# Patient Record
Sex: Male | Born: 1990 | Race: Black or African American | Hispanic: No | Marital: Single | Smoking: Never smoker
Health system: Southern US, Community
[De-identification: ages and names within clinical notes are randomized; demographics above are authoritative.]

---
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
## 2013-12-06 ENCOUNTER — Encounter (HOSPITAL_COMMUNITY): Payer: Self-pay | Admitting: Emergency Medicine

## 2013-12-06 ENCOUNTER — Emergency Department (HOSPITAL_COMMUNITY)
Admission: EM | Admit: 2013-12-06 | Discharge: 2013-12-06 | Disposition: A | Discharge status: Home / Self Care | Payer: BC Managed Care – PPO | Attending: Emergency Medicine | Admitting: Emergency Medicine

## 2013-12-06 ENCOUNTER — Emergency Department (HOSPITAL_COMMUNITY): Payer: BC Managed Care – PPO

## 2013-12-06 DIAGNOSIS — Y9367 Activity, basketball: Secondary | ICD-10-CM | POA: Insufficient documentation

## 2013-12-06 DIAGNOSIS — S99929A Unspecified injury of unspecified foot, initial encounter: Principal | ICD-10-CM

## 2013-12-06 DIAGNOSIS — R269 Unspecified abnormalities of gait and mobility: Secondary | ICD-10-CM | POA: Insufficient documentation

## 2013-12-06 DIAGNOSIS — Y9239 Other specified sports and athletic area as the place of occurrence of the external cause: Secondary | ICD-10-CM | POA: Insufficient documentation

## 2013-12-06 DIAGNOSIS — M25561 Pain in right knee: Secondary | ICD-10-CM

## 2013-12-06 DIAGNOSIS — S8990XA Unspecified injury of unspecified lower leg, initial encounter: Secondary | ICD-10-CM | POA: Insufficient documentation

## 2013-12-06 DIAGNOSIS — X500XXA Overexertion from strenuous movement or load, initial encounter: Secondary | ICD-10-CM | POA: Insufficient documentation

## 2013-12-06 DIAGNOSIS — S99919A Unspecified injury of unspecified ankle, initial encounter: Principal | ICD-10-CM

## 2013-12-06 DIAGNOSIS — Y92838 Other recreation area as the place of occurrence of the external cause: Secondary | ICD-10-CM

## 2013-12-06 MED ORDER — MELOXICAM 7.5 MG PO TABS: 7.5000 mg | ORAL_TABLET | Freq: Every day | ORAL | Status: AC

## 2013-12-06 NOTE — ED Notes (Signed)
Patient given discharge instruction, verbalized understand. Patient ambulatory out of the department.  

## 2013-12-06 NOTE — ED Provider Notes (Signed)
CSN: [id number]     Arrival date & time 12/06/13  1122 History   First MD Initiated Contact with Patient 12/06/13 1152     Chief Complaint  Patient presents with  . Knee Injury     (Consider location/radiation/quality/duration/timing/severity/associated sxs/prior Treatment) Patient is a 23 y.o. male presenting with knee pain. The history is provided by the patient.  Knee Pain Location:  Knee Time since incident:  1 day Injury: yes   Knee location:  R knee Pain details:    Quality:  Aching  Eda KeysMontae R Varnum is a 23 y.o. male who presents to the ED with right knee pain that started yesterday after he was playing basketball yesterday. He was running down the court, jumped up and when he came down felt a pop. He tried to walk it off and later noted swelling and increased pain. Today the pain is not quite as bad but the knee feels real stiff.  He denies any other injuries.   History reviewed. No pertinent past medical history. History reviewed. No pertinent past surgical history. History reviewed. No pertinent family history. History  Substance Use Topics  . Smoking status: Never Smoker   . Smokeless tobacco: Not on file  . Alcohol Use: No    Review of Systems Negative except as stated in HPI   Allergies  Review of patient's allergies indicates no known allergies.  Home Medications   Prior to Admission medications   Not on File   BP 167/91  Pulse 79  Temp(Src) 98.3 F (36.8 C) (Oral)  Resp 18  Ht 6\' 3"  (1.905 m)  Wt 215 lb (97.523 kg)  BMI 26.87 kg/m2  SpO2 100% Physical Exam  Nursing note and vitals reviewed. Constitutional: He is oriented to person, place, and time. He appears well-developed and well-nourished. No distress.  HENT:  Head: Normocephalic.  Eyes: EOM are normal.  Neck: Neck supple.  Cardiovascular: Normal rate.   Pulmonary/Chest: Effort normal.  Musculoskeletal:       Right knee: He exhibits normal range of motion, no ecchymosis, no deformity, no  laceration, no erythema and normal alignment. Swelling: minimal. Tenderness found. MCL and LCL tenderness noted.  Pedal pulses strong, adequate circulation, good touch sensation. Pain with range of motion.  Neurological: He is alert and oriented to person, place, and time. He has normal strength. No sensory deficit. Abnormal gait: due to pain.  Skin: Skin is warm and dry.  Psychiatric: He has a normal mood and affect. His behavior is normal.    ED Course  Procedures Dg Knee Complete 4 Views Right  12/06/2013   CLINICAL DATA:  Injury.  Right knee pain.  EXAM: RIGHT KNEE - COMPLETE 4+ VIEW  COMPARISON:  None.  FINDINGS: There is no evidence of fracture, dislocation, or joint effusion. There is no evidence of arthropathy or other focal bone abnormality. Soft tissues are unremarkable.  IMPRESSION: Negative.   Electronically Signed   By: Amie Portlandavid  Ormond M.D.   On: 12/06/2013 12:30     MDM  23 y.o. male with right knee pain x 24 hours s/p injury. I have reviewed this patient's vital signs, nurses notes, appropriate labs and imaging. I have discussed findings with the patient. I discussed that x-rays will only show fractures and dislocations but will not show tendon/ligament injuries. Discussed need for follow up with ortho is symptoms persist. Patient voices understanding. Placed in knee immobilizer, ice, elevation and pain management.    Medication List  meloxicam 7.5 MG tablet  Commonly known as:  MOBIC  Take 1 tablet (7.5 mg total) by mouth daily.              CottondaleHope M Neese, NP 12/08/13 [phone]  Janne NapoleonHope M Neese, NP 12/08/13 [phone]  Medical screening examination/treatment/procedure(s) were performed by non-physician practitioner and as supervising physician I was immediately available for consultation/collaboration.   EKG Interpretation None       Donnetta HutchingBrian Nickisha Hum, MD 12/08/13 [phone]

## 2013-12-06 NOTE — ED Notes (Signed)
Playing ball yesterday and came down on right knee and feels like he hyperextended it.

## 2013-12-06 NOTE — Care Management Note (Signed)
ED/CM noted patient did not have health insurance and/or PCP listed in the computer.  Patient was given the [place] resource handout with information on the clinics, food pantries, and the handout for new health insurance sign-up.  Patient expressed appreciation for information received. 

## 2015-09-12 IMAGING — CR DG KNEE COMPLETE 4+V*R*
4 series · 4 of 4 positions shown · non-contrast
Comparison: None.

CLINICAL DATA: Injury.  Right knee pain.

EXAM:
RIGHT KNEE - COMPLETE 4+ VIEW

[view not recorded (1 of 4)]
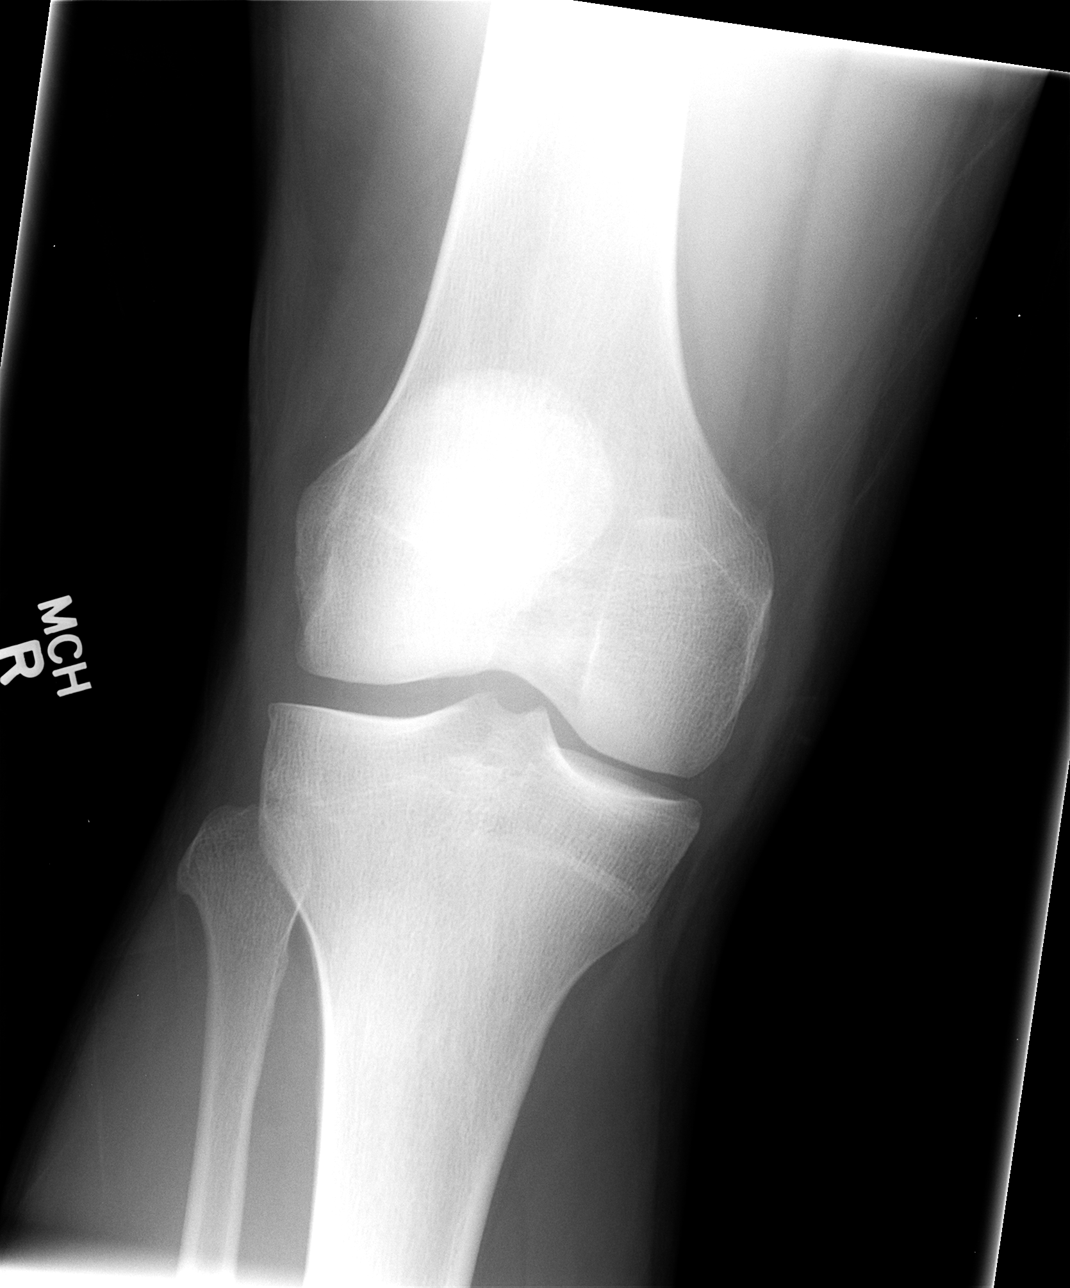

[view not recorded (2 of 4)]
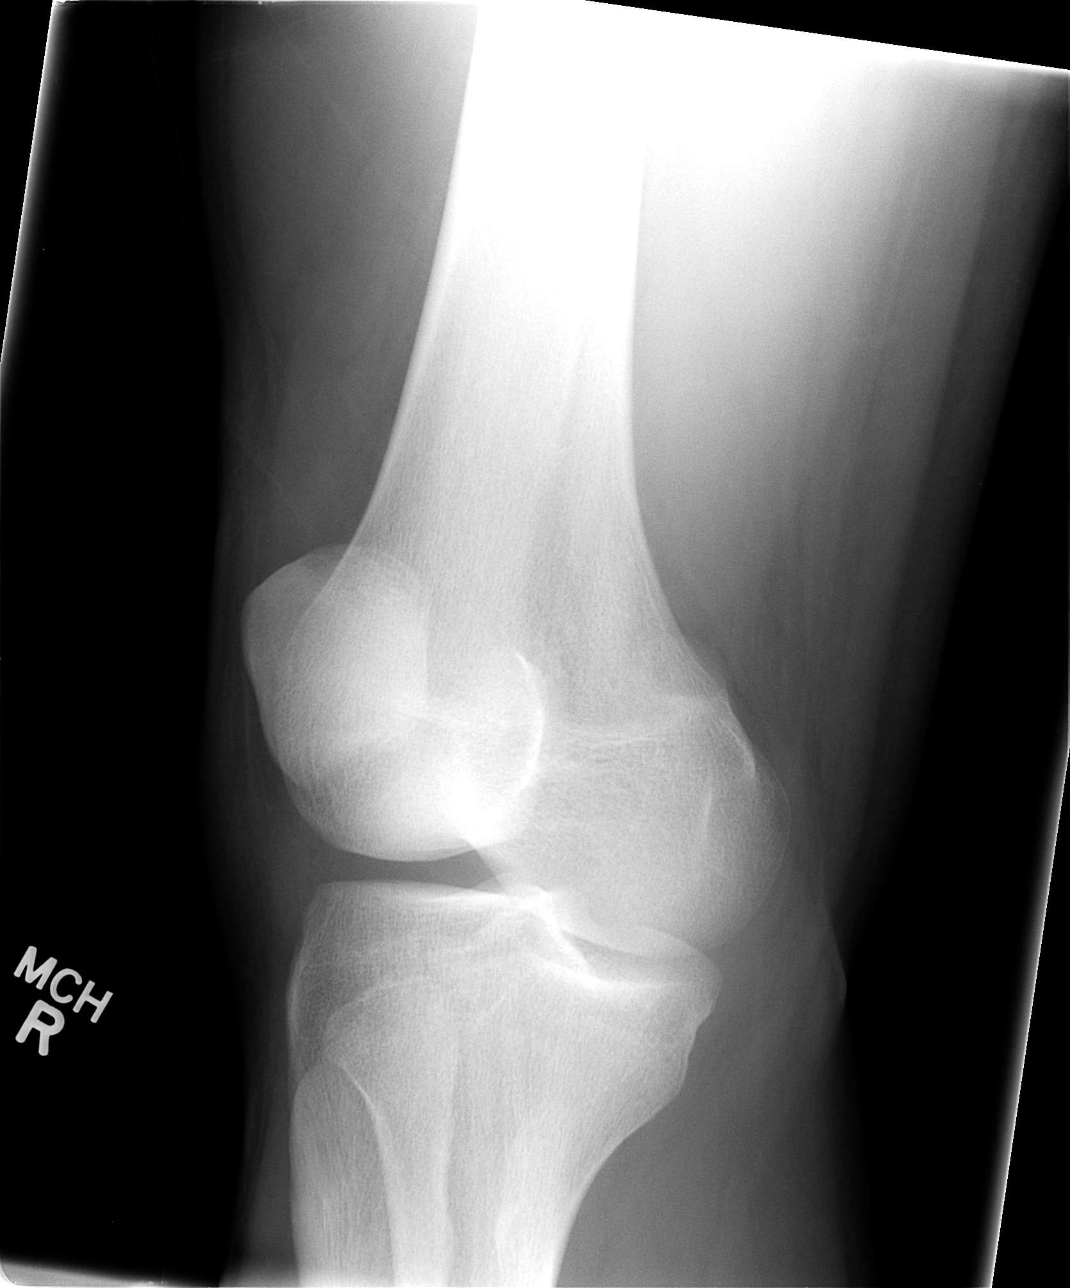

[view not recorded (3 of 4)]
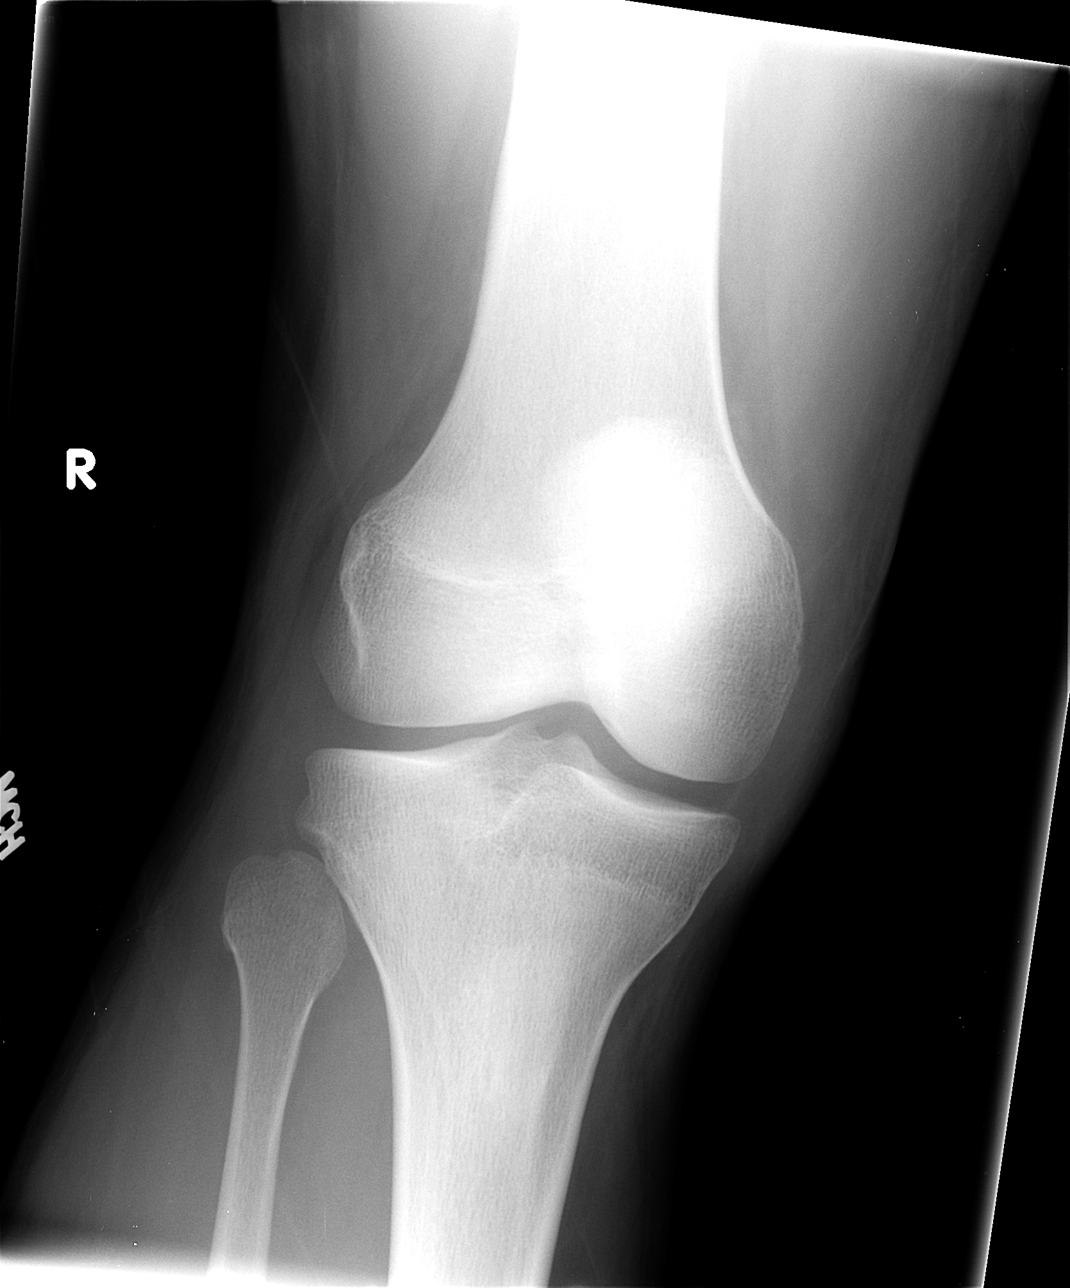

[view not recorded (4 of 4)]
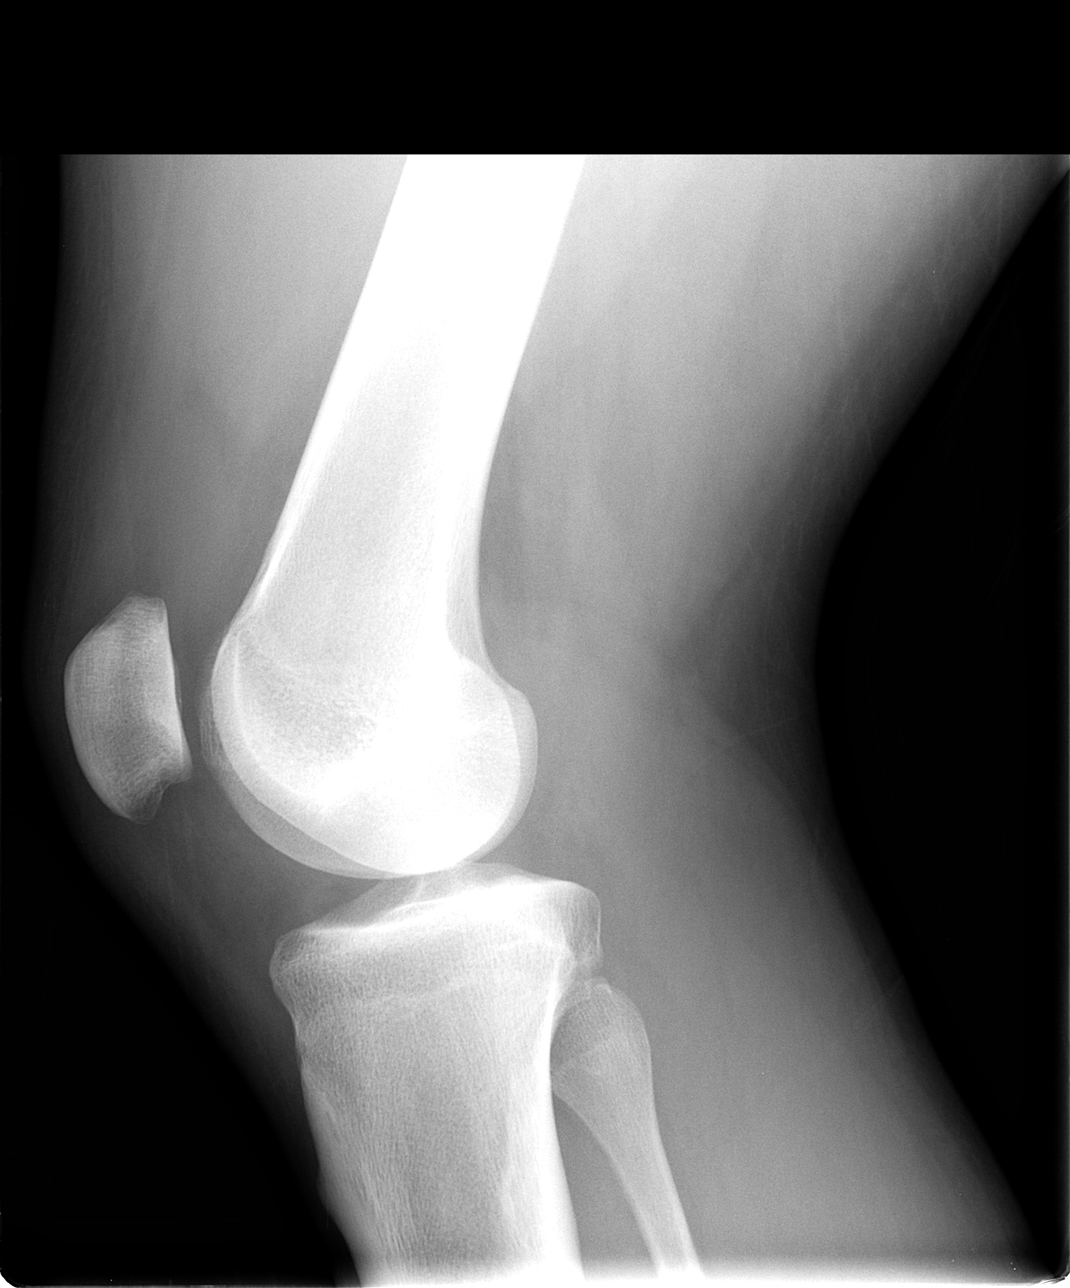

[4 of 4 positions shown; findings below may reference images not displayed]

FINDINGS: There is no evidence of fracture, dislocation, or joint effusion.
There is no evidence of arthropathy or other focal bone abnormality.
Soft tissues are unremarkable.
IMPRESSION: Negative.

## 2017-06-29 ENCOUNTER — Other Ambulatory Visit: Payer: Self-pay | Admitting: Physical Medicine and Rehabilitation

## 2017-06-29 ENCOUNTER — Ambulatory Visit
Admission: RE | Admit: 2017-06-29 | Discharge: 2017-06-29 | Disposition: A | Discharge status: Home / Self Care | Payer: No Typology Code available for payment source | Source: Ambulatory Visit | Attending: Physical Medicine and Rehabilitation | Admitting: Physical Medicine and Rehabilitation

## 2017-06-29 DIAGNOSIS — Z021 Encounter for pre-employment examination: Secondary | ICD-10-CM

## 2019-04-05 IMAGING — DX DG CHEST 1V
1 series · 1 of 1 positions shown · non-contrast
Comparison: None in PACs

CLINICAL DATA: Pre-employment physical examination. No current
complaints. Never smoked.

EXAM:
CHEST 1 VIEW

[dg chest 1 view]
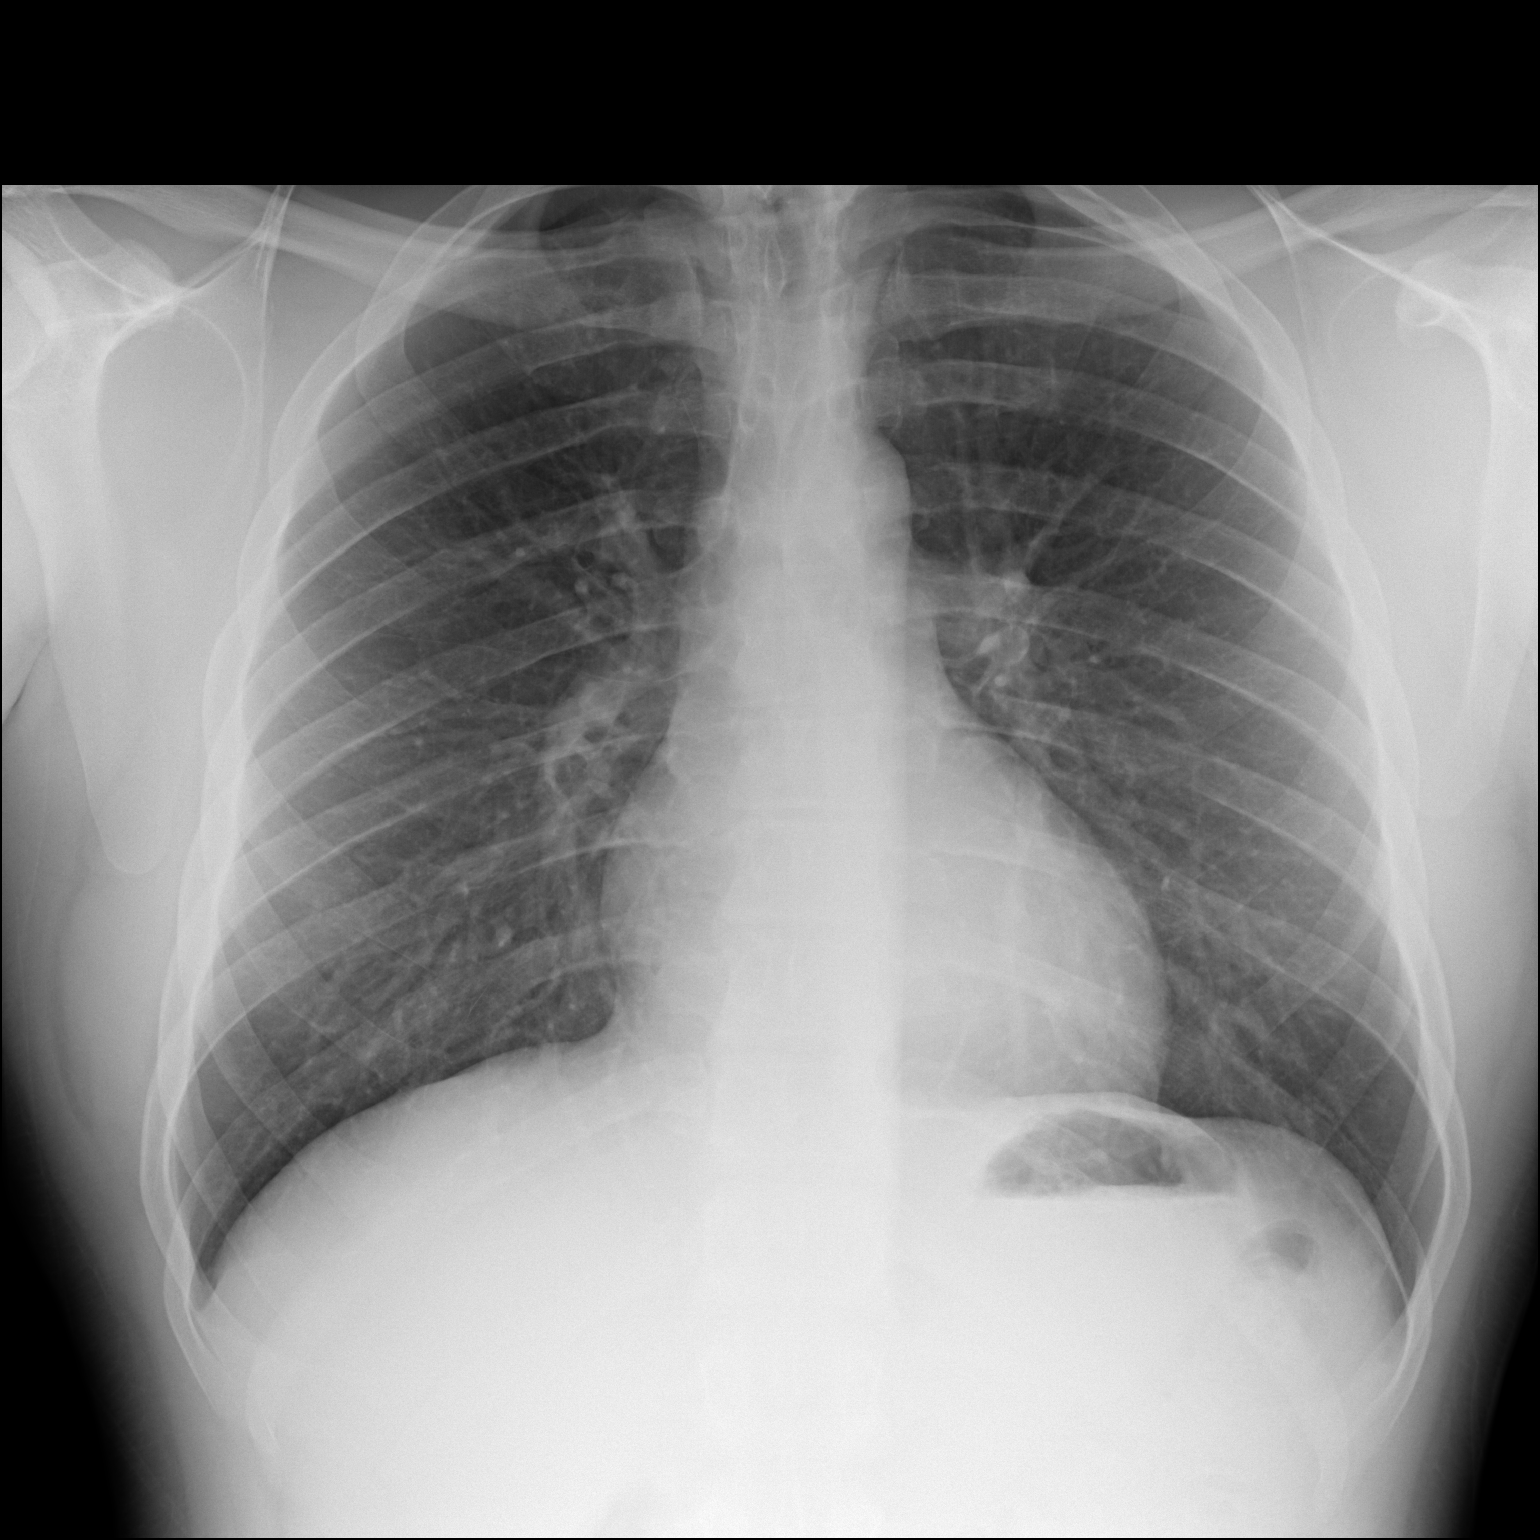

[1 of 1 positions shown; findings below may reference images not displayed]

FINDINGS: The lungs are clear. The heart and pulmonary vascularity are normal.
The mediastinum is normal in width. The bony thorax is unremarkable.
IMPRESSION: There is no active cardiopulmonary disease.
